# Patient Record
Sex: Female | Born: 1965 | Race: White | Hispanic: No | Marital: Single | Smoking: Current every day smoker
Health system: Southern US, Community
[De-identification: ages and names within clinical notes are randomized; demographics above are authoritative.]

## PROBLEM LIST (undated history)

## (undated) DIAGNOSIS — K219 Gastro-esophageal reflux disease without esophagitis: Secondary | ICD-10-CM

## (undated) DIAGNOSIS — B019 Varicella without complication: Secondary | ICD-10-CM

## (undated) HISTORY — DX: Gastro-esophageal reflux disease without esophagitis: K21.9

## (undated) HISTORY — PX: ROTATOR CUFF REPAIR: SHX139

## (undated) HISTORY — DX: Varicella without complication: B01.9

## (undated) HISTORY — PX: BREAST SURGERY: SHX581

---

## 2006-07-16 ENCOUNTER — Emergency Department: Payer: Self-pay | Admitting: Emergency Medicine

## 2007-01-17 ENCOUNTER — Ambulatory Visit: Payer: Self-pay | Admitting: Internal Medicine

## 2007-01-24 ENCOUNTER — Ambulatory Visit: Payer: Self-pay | Admitting: Internal Medicine

## 2007-01-31 ENCOUNTER — Ambulatory Visit: Payer: Self-pay | Admitting: Surgery

## 2007-02-07 ENCOUNTER — Ambulatory Visit: Payer: Self-pay | Admitting: Surgery

## 2007-02-07 HISTORY — PX: BREAST BIOPSY: SHX20

## 2013-08-20 ENCOUNTER — Inpatient Hospital Stay: Payer: Self-pay | Admitting: Specialist

## 2013-08-20 LAB — CBC
HCT: 38.8 % (ref 35.0–47.0)
HGB: 13.2 g/dL (ref 12.0–16.0)
MCH: 31.4 pg (ref 26.0–34.0)
MCHC: 34.1 g/dL (ref 32.0–36.0)
MCV: 92 fL (ref 80–100)
Platelet: 375 10*3/uL (ref 150–440)
RBC: 4.21 10*6/uL (ref 3.80–5.20)
RDW: 13.3 % (ref 11.5–14.5)
WBC: 18.2 10*3/uL — AB (ref 3.6–11.0)

## 2013-08-20 LAB — COMPREHENSIVE METABOLIC PANEL
ALK PHOS: 112 U/L
Albumin: 3.6 g/dL (ref 3.4–5.0)
Anion Gap: 6 — ABNORMAL LOW (ref 7–16)
BUN: 11 mg/dL (ref 7–18)
Bilirubin,Total: 1.1 mg/dL — ABNORMAL HIGH (ref 0.2–1.0)
CO2: 25 mmol/L (ref 21–32)
CREATININE: 0.75 mg/dL (ref 0.60–1.30)
Calcium, Total: 8.5 mg/dL (ref 8.5–10.1)
Chloride: 103 mmol/L (ref 98–107)
EGFR (Non-African Amer.): >60
Glucose: 107 mg/dL — ABNORMAL HIGH (ref 65–99)
OSMOLALITY: 268 (ref 275–301)
Potassium: 3.1 mmol/L — ABNORMAL LOW (ref 3.5–5.1)
SGOT(AST): 33 U/L (ref 15–37)
SGPT (ALT): 22 U/L (ref 12–78)
Sodium: 134 mmol/L — ABNORMAL LOW (ref 136–145)
TOTAL PROTEIN: 7.5 g/dL (ref 6.4–8.2)

## 2013-08-21 LAB — CBC WITH DIFFERENTIAL/PLATELET
Basophil #: 0 10*3/uL (ref 0.0–0.1)
Basophil %: 0.2 %
EOS ABS: 0.2 10*3/uL (ref 0.0–0.7)
Eosinophil %: 1.3 %
HCT: 32.5 % — ABNORMAL LOW (ref 35.0–47.0)
HGB: 11.2 g/dL — ABNORMAL LOW (ref 12.0–16.0)
Lymphocyte #: 1.7 10*3/uL (ref 1.0–3.6)
Lymphocyte %: 12 %
MCH: 31.8 pg (ref 26.0–34.0)
MCHC: 34.4 g/dL (ref 32.0–36.0)
MCV: 92 fL (ref 80–100)
Monocyte #: 0.9 x10 3/mm (ref 0.2–0.9)
Monocyte %: 6.2 %
Neutrophil #: 11.5 10*3/uL — ABNORMAL HIGH (ref 1.4–6.5)
Neutrophil %: 80.3 %
Platelet: 323 10*3/uL (ref 150–440)
RBC: 3.52 10*6/uL — AB (ref 3.80–5.20)
RDW: 13.4 % (ref 11.5–14.5)
WBC: 14.3 10*3/uL — ABNORMAL HIGH (ref 3.6–11.0)

## 2013-08-22 LAB — CBC WITH DIFFERENTIAL/PLATELET
Basophil #: 0 10*3/uL (ref 0.0–0.1)
Basophil %: 0.3 %
EOS PCT: 3.1 %
Eosinophil #: 0.3 10*3/uL (ref 0.0–0.7)
HCT: 31.4 % — AB (ref 35.0–47.0)
HGB: 11 g/dL — ABNORMAL LOW (ref 12.0–16.0)
LYMPHS PCT: 8.8 %
Lymphocyte #: 1 10*3/uL (ref 1.0–3.6)
MCH: 32.5 pg (ref 26.0–34.0)
MCHC: 35.1 g/dL (ref 32.0–36.0)
MCV: 93 fL (ref 80–100)
MONO ABS: 0.7 x10 3/mm (ref 0.2–0.9)
Monocyte %: 5.9 %
NEUTROS ABS: 9.2 10*3/uL — AB (ref 1.4–6.5)
NEUTROS PCT: 81.9 %
Platelet: 313 10*3/uL (ref 150–440)
RBC: 3.39 10*6/uL — AB (ref 3.80–5.20)
RDW: 12.8 % (ref 11.5–14.5)
WBC: 11.3 10*3/uL — ABNORMAL HIGH (ref 3.6–11.0)

## 2013-08-22 LAB — CREATININE, SERUM
Creatinine: 0.75 mg/dL (ref 0.60–1.30)
EGFR (African American): >60

## 2013-08-24 LAB — CBC WITH DIFFERENTIAL/PLATELET
BASOS PCT: 0.5 %
Basophil #: 0 10*3/uL (ref 0.0–0.1)
EOS ABS: 0.3 10*3/uL (ref 0.0–0.7)
Eosinophil %: 3.1 %
HCT: 33.1 % — ABNORMAL LOW (ref 35.0–47.0)
HGB: 11.5 g/dL — ABNORMAL LOW (ref 12.0–16.0)
LYMPHS ABS: 1.1 10*3/uL (ref 1.0–3.6)
Lymphocyte %: 13.3 %
MCH: 32 pg (ref 26.0–34.0)
MCHC: 34.8 g/dL (ref 32.0–36.0)
MCV: 92 fL (ref 80–100)
Monocyte #: 0.8 x10 3/mm (ref 0.2–0.9)
Monocyte %: 9.8 %
Neutrophil #: 5.9 10*3/uL (ref 1.4–6.5)
Neutrophil %: 73.3 %
PLATELETS: 404 10*3/uL (ref 150–440)
RBC: 3.61 10*6/uL — ABNORMAL LOW (ref 3.80–5.20)
RDW: 13 % (ref 11.5–14.5)
WBC: 8.1 10*3/uL (ref 3.6–11.0)

## 2013-08-24 LAB — CREATININE, SERUM
Creatinine: 0.78 mg/dL (ref 0.60–1.30)
EGFR (African American): >60

## 2013-08-24 LAB — VANCOMYCIN, TROUGH: VANCOMYCIN, TROUGH: 7 ug/mL — AB (ref 10–20)

## 2013-08-25 LAB — VANCOMYCIN, TROUGH: Vancomycin, Trough: 17 ug/mL (ref 10–20)

## 2013-08-26 LAB — WOUND CULTURE

## 2014-09-27 NOTE — Consult Note (Signed)
Brief Consult Note: Diagnosis: left thigh abscess and cellulitis.   Patient was seen by consultant.   Consult note dictated.   Recommend further assessment or treatment.   Orders entered.   Discussed with Attending MD.   Comments: no packing being changed since admit. Orders entered for TID dressings. will reexamine.  Electronic Signatures: Florene Glen (MD)  (Signed 19-Mar-15 13:42)  Authored: Brief Consult Note   Last Updated: 19-Mar-15 13:42 by Florene Glen (MD)

## 2014-09-27 NOTE — Discharge Summary (Signed)
PATIENT NAME:  Julie Cole, Julie Cole MR#:  [id number] DATE OF BIRTH:  07-Mar-1966  DATE OF ADMISSION:  08/20/2013 DATE OF DISCHARGE:  08/25/2013  For a detailed note, please see the history and physical done on admission by Dr. Fritzi Mandes.   DIAGNOSES AT DISCHARGE: As follows:  1. Systemic inflammatory response syndrome secondary to abscess/cellulitis in the left inguinal area.  2. Leukocytosis.   DIET: The patient is being discharged on a regular diet.   ACTIVITY: As tolerated.   Follow-up with Dr. Phoebe Perch from general surgery next week.  DISCHARGE MEDICATIONS: Bactrim double strength 1 tab b.i.d. x 10 days and ibuprofen 600 mg q.6 hours as needed.   CONSULTANTS DURING THE HOSPITAL COURSE: Dr. Phoebe Perch from general surgery.   PERTINENT STUDIES DONE DURING THE HOSPITAL COURSE: Are as follows: The patient's wound cultures to be positive for normal skin flora.   HOSPITAL COURSE: This is a 49 year old female with medical problems as mentioned above, presented to the hospital with left inguinal area of redness and swelling and also fevers.   PROBLEMS: 1. Systemic inflammatory response syndrome. This was the likely diagnosis. The patient presented with fever, tachycardia and a left inguinal area/groin area cellulitis. The patient was admitted to the hospital. Initially started on IV Ancef and oral Bactrim. The patient had a small incision and drainage done by an ER physician prior to admission. The patient shortly after admission had some worsening redness and swelling; therefore, a surgical consult was obtained. The patient was seen by Dr. Burt Knack, who did not perform any further incision and drainage, but just changed the packing and the dressing. We switched her from Ancef to IV vancomycin. The patient has significantly improved since then. She had redness, has significantly gone down. She continues to still have a small hardened area, which is likely inflammatory in nature and unlikely  a fluid collection as per surgery. At this point, I am discharging her on some oral Bactrim and ibuprofen for pain control.  2. Left inguinal cellulitis/abscess. The patient initially was treated with IV Ancef and Bactrim. eventually switched over to IV vancomycin as she was not clinically improving. A surgical consult was obtained. She did not require any further incision and drainage. She just needed to have her dressing changed and the packing removed, which was done by Dr. Burt Knack. She has clinically improved since then. Currently she is being discharged on oral Bactrim. She will follow up with Dr. Burt Knack as an outpatient next week or so.  3. Leukocytosis. This was likely secondary to the cellulitis/abscess. It has significantly improved and now normalized with IV antibiotic therapy.   The patient is a FULL CODE.   TIME SPENT ON DISCHARGE: 40 minutes   ____________________________ Belia Heman. Verdell Carmine, MD vjs:sg D: 08/25/2013 16:15:38 ET T: 08/26/2013 07:06:12 ET JOB#: [id number]  cc: Belia Heman. Verdell Carmine, MD, <Dictator> Richard E. Burt Knack, MD  Henreitta Leber MD ELECTRONICALLY SIGNED 08/30/2013 20:27

## 2014-09-27 NOTE — H&P (Signed)
PATIENT NAME:  Julie Cole, Julie Cole MR#:  [id number] DATE OF BIRTH:  1966/01/10  DATE OF ADMISSION:  08/20/2013  PRIMARY CARE PHYSICIAN:  None.  CHIEF COMPLAINT:   Left groin redness and swelling with pain for 2 days.   Julie Cole is a 49 year old obese Caucasian female with no significant past medical history, comes into the Emergency Room with complaints of pain and redness along with swelling in her left groin. She was found to have a large abscess, for which incision and drainage was done by Dr. Thomasene Lot in the Emergency Room, along with some packing done. The patient has significant cellulitis with elevated white count. She was tachycardic also on arrival. She is being admitted with SIRS secondary to left groin abscess. She received IV Keflex p.o.  Bactrim has been started.   PAST MEDICAL HISTORY:  None.   ALLERGIES: No known drug allergies.   MEDICATIONS: None.   SOCIAL HISTORY: Lives at home. She works with [employer]. Smokes 1 to 2 times a week. Drinks alcohol then she smokes.   FAMILY HISTORY: Not aware of any family history with mother and father.   REVIEW OF SYSTEMS:  CONSTITUTIONAL: No fever. Positive for fatigue, weakness and left groin pain.  EYES: No blurred or double vision, glaucoma or cataracts.  ENT: No tinnitus, ear pain, hearing loss.  RESPIRATORY: No cough, wheeze, hemoptysis or COPD.  CARDIOVASCULAR: No chest pain, orthopnea, edema or hypertension.  GASTROINTESTINAL: No nausea, vomiting, diarrhea, abdominal pain or GERD.  GENITOURINARY: No dysuria or hematuria.  ENDOCRINE: No polyuria, nocturia or thyroid problems.  HEMATOLOGY: No anemia or easy bruising.  SKIN: Positive for cellulitis over the left groin along with an abscess.  MUSCULOSKELETAL: No arthritis, swelling or gout.  NEUROLOGIC: No CVA, TIA, vertigo or ataxia.  PSYCHIATRIC: No anxiety, depression or bipolar disorder. All other systems reviewed are negative.   PHYSICAL  EXAMINATION: GENERAL: The patient is awake, alert, oriented x 3, not in acute distress.  VITAL SIGNS: Afebrile. Pulse is 100, respirations 18, blood pressure 138/74. Morbidly obese.  HEENT: Atraumatic, normocephalic. Pupils: PERRLA.  EOM intact. Oral mucosa is moist.  NECK: Supple. No JVD. No carotid bruits.  RESPIRATORY: Clear to auscultation bilaterally. No rales, rhonchi, respiratory distress or labored breathing.  CARDIOVASCULAR: Both the heart sounds are normal. Rate, rhythm regular. PMI not lateralized. Chest is nontender. Good pedal pulses, good femoral pulses. No lower extremity edema.  ABDOMEN: Soft, benign, nontender. No organomegaly.  EXTREMITIES: The patient has got a large abscess over the left groin with incision and drainage with packing done. She has got cellulitis around the area, which has been marked with a marker.  NEUROLOGIC: Grossly intact cranial nerves II through XII. No motor or sensory deficit.  PSYCHIATRIC: The patient is awake, alert, oriented x 3.   White count is 18.2. H and H are 13.2 and 38.8. Platelet count is 375. Comprehensive metabolic panel within normal limits except potassium of 3.1, sodium of 134 and glucose of 107.   ASSESSMENT AND PLAN:  A 49 year old Julie Cole with history of tobacco and alcohol use in moderation, comes in with:  1.  Systemic inflammatory response syndrome secondary to left groin abscess, which is status post incision and drainage by Dr. Thomasene Lot in the Emergency Room. Wound culture has been sent.  We will admit the patient to the medical floor. Continue IV Keflex around the clock, along with Bactrim for MRSA coverage. Wound cultures have been sent. Follow blood cultures and wound cultures.  P.r.n. pain medications with ibuprofen and Percocet.  2.  Leukocytosis secondary to abscess. Follow up counts in the morning.  3.  Tachycardia secondary to abscess and systemic inflammatory response syndrome.  4. Tobacco abuse. The patient  smokes about 1 to 3 times a week. She is advised on smoking cessation, about 3 minutes spent on counseling.  5. Deep vein thrombosis prophylaxis. The patient is ambulatory.   Further workup according to the patient's clinical course. Hospital admission plan was discussed with patient, who is agreeable to it.   TIME SPENT: 50 minutes.   ____________________________ Hart Rochester Posey Pronto, MD sap:dmm D: 08/20/2013 21:02:37 ET T: 08/20/2013 22:21:26 ET JOB#: [id number]  cc: Skyelar Swigart A. Posey Pronto, MD, <Dictator> Ilda Basset MD ELECTRONICALLY SIGNED 08/23/2013 10:48

## 2014-09-27 NOTE — Consult Note (Signed)
PATIENT NAME:  Julie Cole, Julie Cole MR#:  [id number] DATE OF BIRTH:  27-Jul-1965  DATE OF CONSULTATION:  08/22/2013  CONSULTING PHYSICIAN:  Jerrol Banana. Burt Knack, MD  CHIEF COMPLAINT: Left thigh abscess.   HISTORY OF PRESENT ILLNESS: This is a patient who has had left thigh cellulitis and pain worsening over the last three days. She was admitted to the hospital yesterday where the Emergency Room physician performed lancing of the abscess in the left groin. Cultures are currently pending. Dr. Verdell Carmine asked me to see the patient concerning worsening of her redness.   PAST MEDICAL HISTORY: Nondiabetic, morbid obesity.   PAST SURGICAL HISTORY: None.   ALLERGIES: None.   MEDICATIONS: None.   FAMILY HISTORY: Noncontributory.   SOCIAL HISTORY: The patient smokes occasionally. Does not drink alcohol.   REVIEW OF SYSTEMS:  A 10 system review is performed and negative with the exception of that mentioned in the HPI.   PHYSICAL EXAMINATION: GENERAL: Morbidly obese female patient with a BMI of 38.  VITAL SIGNS: Temperature is 99, pulse 72, respirations 20, blood pressure 131/81.  HEENT: No scleral icterus.  NECK: No palpable neck nodes.  ABDOMEN: Soft, nontender.  EXTREMITIES: Without edema.  NEUROLOGIC: Grossly intact.  INTEGUMENT: Erythema of the proximal left groin that does not radiate through the posterior thigh, it is all anterior and medial and markings have been placed. There is a 1.5 cm wound with packing in place. That packing has never been removed from the I and D yesterday and is covered in purulent material. Minimal tenderness. No other fluctuance.   Dressing and material was removed from the wound.   LABORATORY VALUES: Reviewed.  Cultures are currently pending. White blood cell count is fallen to 11.3.   I discussed with Dr. Verdell Carmine the findings in the wound and I recommend q.8 dressing changes. I spoke in the nursing and placed orders. I do not think this requires any additional  drainage at this point, but if it does not rapidly improve now the dressings are being changed in the next 24 to 48 hours, exam under anesthesia and further drainage may be indicated.  ____________________________ Jerrol Banana. Burt Knack, MD rec:sg D: 08/22/2013 13:45:00 ET T: 08/22/2013 14:03:41 ET JOB#: [id number]  cc: Jerrol Banana. Burt Knack, MD, <Dictator> Florene Glen MD ELECTRONICALLY SIGNED 08/22/2013 17:41

## 2014-09-30 ENCOUNTER — Ambulatory Visit
Admission: RE | Admit: 2014-09-30 | Discharge: 2014-09-30 | Disposition: A | Discharge status: Home / Self Care | Payer: 59 | Source: Ambulatory Visit | Attending: Orthopedic Surgery | Admitting: Orthopedic Surgery

## 2014-09-30 ENCOUNTER — Other Ambulatory Visit: Payer: Self-pay | Admitting: Orthopedic Surgery

## 2014-09-30 DIAGNOSIS — Z87891 Personal history of nicotine dependence: Secondary | ICD-10-CM

## 2014-09-30 DIAGNOSIS — Z01818 Encounter for other preprocedural examination: Secondary | ICD-10-CM

## 2017-03-23 ENCOUNTER — Ambulatory Visit (INDEPENDENT_AMBULATORY_CARE_PROVIDER_SITE_OTHER): Payer: 59 | Admitting: Family

## 2017-03-23 ENCOUNTER — Encounter: Payer: Self-pay | Admitting: Family

## 2017-03-23 VITALS — BP 136/80 | HR 76 | Temp 97.8°F | Ht 69.0 in | Wt 260.6 lb

## 2017-03-23 DIAGNOSIS — Z23 Encounter for immunization: Secondary | ICD-10-CM | POA: Diagnosis not present

## 2017-03-23 DIAGNOSIS — Z7689 Persons encountering health services in other specified circumstances: Secondary | ICD-10-CM

## 2017-03-23 DIAGNOSIS — K625 Hemorrhage of anus and rectum: Secondary | ICD-10-CM | POA: Diagnosis not present

## 2017-03-23 DIAGNOSIS — Z0000 Encounter for general adult medical examination without abnormal findings: Secondary | ICD-10-CM | POA: Insufficient documentation

## 2017-03-23 LAB — CBC WITH DIFFERENTIAL/PLATELET
Basophils Absolute: 0 10*3/uL (ref 0.0–0.1)
Basophils Relative: 1 % (ref 0.0–3.0)
Eosinophils Absolute: 0.1 10*3/uL (ref 0.0–0.7)
Eosinophils Relative: 3.9 % (ref 0.0–5.0)
HCT: 39.8 % (ref 36.0–46.0)
Hemoglobin: 13.4 g/dL (ref 12.0–15.0)
Lymphocytes Relative: 26.6 % (ref 12.0–46.0)
Lymphs Abs: 0.9 10*3/uL (ref 0.7–4.0)
MCHC: 33.8 g/dL (ref 30.0–36.0)
MCV: 94.9 fl (ref 78.0–100.0)
Monocytes Absolute: 0.4 10*3/uL (ref 0.1–1.0)
Monocytes Relative: 11 % (ref 3.0–12.0)
Neutro Abs: 2 10*3/uL (ref 1.4–7.7)
Neutrophils Relative %: 57.5 % (ref 43.0–77.0)
Platelets: 333 10*3/uL (ref 150.0–400.0)
RBC: 4.19 Mil/uL (ref 3.87–5.11)
RDW: 13.7 % (ref 11.5–15.5)
WBC: 3.4 10*3/uL — ABNORMAL LOW (ref 4.0–10.5)

## 2017-03-23 LAB — COMPREHENSIVE METABOLIC PANEL
ALK PHOS: 67 U/L (ref 39–117)
ALT: 18 U/L (ref 0–35)
AST: 22 U/L (ref 0–37)
Albumin: 4.4 g/dL (ref 3.5–5.2)
BILIRUBIN TOTAL: 0.5 mg/dL (ref 0.2–1.2)
BUN: 17 mg/dL (ref 6–23)
CALCIUM: 9.8 mg/dL (ref 8.4–10.5)
CO2: 30 meq/L (ref 19–32)
CREATININE: 0.72 mg/dL (ref 0.40–1.20)
Chloride: 105 mEq/L (ref 96–112)
GFR: 90.74 mL/min (ref >60.00)
GLUCOSE: 90 mg/dL (ref 70–99)
Potassium: 4.1 mEq/L (ref 3.5–5.1)
Sodium: 142 mEq/L (ref 135–145)
TOTAL PROTEIN: 7.2 g/dL (ref 6.0–8.3)

## 2017-03-23 LAB — VITAMIN D 25 HYDROXY (VIT D DEFICIENCY, FRACTURES): VITD: 12.95 ng/mL — AB (ref 30.00–100.00)

## 2017-03-23 LAB — LIPID PANEL
CHOL/HDL RATIO: 4
CHOLESTEROL: 224 mg/dL — AB (ref 0–200)
HDL: 56.6 mg/dL (ref >39.00)
NONHDL: 167.55
Triglycerides: 259 mg/dL — ABNORMAL HIGH (ref 0.0–149.0)
VLDL: 51.8 mg/dL — ABNORMAL HIGH (ref 0.0–40.0)

## 2017-03-23 LAB — HEMOGLOBIN A1C: Hgb A1c MFr Bld: 5.1 % (ref 4.6–6.5)

## 2017-03-23 LAB — TSH: TSH: 0.99 u[IU]/mL (ref 0.35–4.50)

## 2017-03-23 LAB — LDL CHOLESTEROL, DIRECT: LDL DIRECT: 124 mg/dL

## 2017-03-23 NOTE — Assessment & Plan Note (Signed)
Pending cpe labs. Patient will schedule mammogram. She will return for cpe and pap.

## 2017-03-23 NOTE — Assessment & Plan Note (Addendum)
Resolved at this time. Symptoms most consistent with hemorrhoids. Patient politely declines rectal exam today. Due to the rectal bleeding, patient jointly agreed with pursue colonoscopy versus Cologuard. Ordered colonoscopy. Return precautions given.

## 2017-03-23 NOTE — Progress Notes (Signed)
Subjective:    Patient ID: Julie Cole, female    DOB: 07-07-1965, 51 y.o.   MRN: [id number]  CC: MONICA CODD is a 51 y.o. female who presents today to establish care.    HPI: 62 with [facility] for acne- on spironolactone.   She declines a chronic, intermittent rectal bleeding. None the past 3 months.   Bright red blood on toilet paper. Notes when 'straining.' No anal itching, pain with BM. No melana, coffee ground stools, N, V, adbominal pain.  Regular bowels habits, usually daily.   No longer have menstrual cycles.      HISTORY:  Past Medical History:  Diagnosis Date  . Chicken pox   . GERD (gastroesophageal reflux disease)    Past Surgical History:  Procedure Laterality Date  . BREAST SURGERY    . ROTATOR CUFF REPAIR     Family History  Problem Relation Age of Onset  . Colon cancer Neg Hx   . Breast cancer Neg Hx     Allergies: Patient has no allergy information on record. No current outpatient prescriptions on file prior to visit.   No current facility-administered medications on file prior to visit.     Social History  Substance Use Topics  . Smoking status: Current Every Day Smoker  . Smokeless tobacco: Never Used  . Alcohol use Yes    Review of Systems  Constitutional: Negative for chills and fever.  Respiratory: Negative for cough.   Cardiovascular: Negative for chest pain and palpitations.  Gastrointestinal: Positive for anal bleeding. Negative for abdominal pain, blood in stool, constipation, diarrhea, nausea and vomiting.      Objective:    BP 136/80   Pulse 76   Temp 97.8 F (36.6 C) (Oral)   Ht 5\' 9"  (1.753 m)   Wt 260 lb 9.6 oz (118.2 kg)   SpO2 96%   BMI 38.48 kg/m  BP Readings from Last 3 Encounters:  03/23/17 136/80   Wt Readings from Last 3 Encounters:  03/23/17 260 lb 9.6 oz (118.2 kg)    Physical Exam  Constitutional: She appears well-developed and well-nourished.  Eyes: Conjunctivae are normal.   Cardiovascular: Normal rate, regular rhythm, normal heart sounds and normal pulses.   Pulmonary/Chest: Effort normal and breath sounds normal. She has no wheezes. She has no rhonchi. She has no rales.  Neurological: She is alert.  Skin: Skin is warm and dry.  Psychiatric: She has a normal mood and affect. Her speech is normal and behavior is normal. Thought content normal.  Vitals reviewed.      Assessment & Plan:   Problem List Items Addressed This Visit      Digestive   Rectal bleeding    Resolved at this time. Symptoms most consistent with hemorrhoids. Patient politely declines rectal exam today. Due to the rectal bleeding, patient jointly agreed with pursue colonoscopy versus Cologuard. Ordered colonoscopy. Return precautions given.        Other   Encounter to establish care - Primary    Pending cpe labs. Patient will schedule mammogram. She will return for cpe and pap.       Relevant Orders   CBC with Differential/Platelet   Comprehensive metabolic panel   Hemoglobin A1c   Lipid panel   TSH   VITAMIN D 25 Hydroxy (Vit-D Deficiency, Fractures)   HIV antibody   MM SCREENING BREAST TOMO BILATERAL   Ambulatory referral to Gastroenterology    Other Visit Diagnoses    Need for  immunization against influenza       Relevant Orders   Flu Vaccine QUAD 36+ mos IM (Completed)       I am having Ms. Mcmanaman maintain her spironolactone.   Meds ordered this encounter  Medications  . spironolactone (ALDACTONE) 50 MG tablet    Sig: Take 50 mg by mouth daily.    Return precautions given.   Risks, benefits, and alternatives of the medications and treatment plan prescribed today were discussed, and patient expressed understanding.   Education regarding symptom management and diagnosis given to patient on AVS.  Continue to follow with Burnard Hawthorne, FNP for routine health maintenance.   Julie Cole and I agreed with plan.   Mable Paris, FNP

## 2017-03-23 NOTE — Progress Notes (Signed)
Pre visit review using our clinic review tool, if applicable. No additional management support is needed unless otherwise documented below in the visit note. 

## 2017-03-23 NOTE — Patient Instructions (Addendum)
Tdap ( tetanus) at local pharmacy  Fasting labs  If you see blood again from rectum, please let me know  Colonoscopy orderd  For suspected hemorrhoids, ensure plenty of water, high fiber diet. May also start colace, stool softener, to reduce straining   Please return for pap smear and annual physical   Hemorrhoids Hemorrhoids are swollen veins in and around the rectum or anus. There are two types of hemorrhoids:  Internal hemorrhoids. These occur in the veins that are just inside the rectum. They may poke through to the outside and become irritated and painful.  External hemorrhoids. These occur in the veins that are outside of the anus and can be felt as a painful swelling or hard lump near the anus.  Most hemorrhoids do not cause serious problems, and they can be managed with home treatments such as diet and lifestyle changes. If home treatments do not help your symptoms, procedures can be done to shrink or remove the hemorrhoids. What are the causes? This condition is caused by increased pressure in the anal area. This pressure may result from various things, including:  Constipation.  Straining to have a bowel movement.  Diarrhea.  Pregnancy.  Obesity.  Sitting for long periods of time.  Heavy lifting or other activity that causes you to strain.  Anal sex.  What are the signs or symptoms? Symptoms of this condition include:  Pain.  Anal itching or irritation.  Rectal bleeding.  Leakage of stool (feces).  Anal swelling.  One or more lumps around the anus.  How is this diagnosed? This condition can often be diagnosed through a visual exam. Other exams or tests may also be done, such as:  Examination of the rectal area with a gloved hand (digital rectal exam).  Examination of the anal canal using a small tube (anoscope).  A blood test, if you have lost a significant amount of blood.  A test to look inside the colon (sigmoidoscopy or  colonoscopy).  How is this treated? This condition can usually be treated at home. However, various procedures may be done if dietary changes, lifestyle changes, and other home treatments do not help your symptoms. These procedures can help make the hemorrhoids smaller or remove them completely. Some of these procedures involve surgery, and others do not. Common procedures include:  Rubber band ligation. Rubber bands are placed at the base of the hemorrhoids to cut off the blood supply to them.  Sclerotherapy. Medicine is injected into the hemorrhoids to shrink them.  Infrared coagulation. A type of light energy is used to get rid of the hemorrhoids.  Hemorrhoidectomy surgery. The hemorrhoids are surgically removed, and the veins that supply them are tied off.  Stapled hemorrhoidopexy surgery. A circular stapling device is used to remove the hemorrhoids and use staples to cut off the blood supply to them.  Follow these instructions at home: Eating and drinking  Eat foods that have a lot of fiber in them, such as whole grains, beans, nuts, fruits, and vegetables. Ask your health care provider about taking products that have added fiber (fiber supplements).  Drink enough fluid to keep your urine clear or pale yellow. Managing pain and swelling  Take warm sitz baths for 20 minutes, 3-4 times a day to ease pain and discomfort.  If directed, apply ice to the affected area. Using ice packs between sitz baths may be helpful. ? Put ice in a plastic bag. ? Place a towel between your skin and the bag. ?  Leave the ice on for 20 minutes, 2-3 times a day. General instructions  Take over-the-counter and prescription medicines only as told by your health care provider.  Use medicated creams or suppositories as told.  Exercise regularly.  Go to the bathroom when you have the urge to have a bowel movement. Do not wait.  Avoid straining to have bowel movements.  Keep the anal area dry and  clean. Use wet toilet paper or moist towelettes after a bowel movement.  Do not sit on the toilet for long periods of time. This increases blood pooling and pain. Contact a health care provider if:  You have increasing pain and swelling that are not controlled by treatment or medicine.  You have uncontrolled bleeding.  You have difficulty having a bowel movement, or you are unable to have a bowel movement.  You have pain or inflammation outside the area of the hemorrhoids. This information is not intended to replace advice given to you by your health care provider. Make sure you discuss any questions you have with your health care provider. Document Released: 05/20/2000 Document Revised: 10/21/2015 Document Reviewed: 02/04/2015 Elsevier Interactive Patient Education  2017 [employer].

## 2017-03-24 LAB — HIV ANTIBODY (ROUTINE TESTING W REFLEX): HIV: NONREACTIVE

## 2017-03-30 ENCOUNTER — Inpatient Hospital Stay: Admission: RE | Admit: 2017-03-30 | Payer: 59 | Source: Ambulatory Visit

## 2017-04-06 ENCOUNTER — Other Ambulatory Visit (HOSPITAL_COMMUNITY)
Admission: RE | Admit: 2017-04-06 | Discharge: 2017-04-06 | Disposition: A | Discharge status: Home / Self Care | Payer: 59 | Source: Ambulatory Visit | Attending: Family | Admitting: Family

## 2017-04-06 ENCOUNTER — Encounter: Payer: Self-pay | Admitting: Family

## 2017-04-06 ENCOUNTER — Ambulatory Visit (INDEPENDENT_AMBULATORY_CARE_PROVIDER_SITE_OTHER): Payer: 59 | Admitting: Family

## 2017-04-06 VITALS — BP 116/74 | HR 86 | Temp 97.6°F | Ht 69.0 in | Wt 258.0 lb

## 2017-04-06 DIAGNOSIS — Z0000 Encounter for general adult medical examination without abnormal findings: Secondary | ICD-10-CM | POA: Diagnosis not present

## 2017-04-06 NOTE — Patient Instructions (Addendum)
tdap due  Pleasure seeing you!  Health Maintenance, Female Adopting a healthy lifestyle and getting preventive care can go a long way to promote health and wellness. Talk with your health care provider about what schedule of regular examinations is right for you. This is a good chance for you to check in with your provider about disease prevention and staying healthy. In between checkups, there are plenty of things you can do on your own. Experts have done a lot of research about which lifestyle changes and preventive measures are most likely to keep you healthy. Ask your health care provider for more information. Weight and diet Eat a healthy diet  Be sure to include plenty of vegetables, fruits, low-fat dairy products, and lean protein.  Do not eat a lot of foods high in solid fats, added sugars, or salt.  Get regular exercise. This is one of the most important things you can do for your health. ? Most adults should exercise for at least 150 minutes each week. The exercise should increase your heart rate and make you sweat (moderate-intensity exercise). ? Most adults should also do strengthening exercises at least twice a week. This is in addition to the moderate-intensity exercise.  Maintain a healthy weight  Body mass index (BMI) is a measurement that can be used to identify possible weight problems. It estimates body fat based on height and weight. Your health care provider can help determine your BMI and help you achieve or maintain a healthy weight.  For females 68 years of age and older: ? A BMI below 18.5 is considered underweight. ? A BMI of 18.5 to 24.9 is normal. ? A BMI of 25 to 29.9 is considered overweight. ? A BMI of 30 and above is considered obese.  Watch levels of cholesterol and blood lipids  You should start having your blood tested for lipids and cholesterol at 51 years of age, then have this test every 5 years.  You may need to have your cholesterol levels  checked more often if: ? Your lipid or cholesterol levels are high. ? You are older than 51 years of age. ? You are at high risk for heart disease.  Cancer screening Lung Cancer  Lung cancer screening is recommended for adults 71-50 years old who are at high risk for lung cancer because of a history of smoking.  A yearly low-dose CT scan of the lungs is recommended for people who: ? Currently smoke. ? Have quit within the past 15 years. ? Have at least a 30-pack-year history of smoking. A pack year is smoking an average of one pack of cigarettes a day for 1 year.  Yearly screening should continue until it has been 15 years since you quit.  Yearly screening should stop if you develop a health problem that would prevent you from having lung cancer treatment.  Breast Cancer  Practice breast self-awareness. This means understanding how your breasts normally appear and feel.  It also means doing regular breast self-exams. Let your health care provider know about any changes, no matter how small.  If you are in your 20s or 30s, you should have a clinical breast exam (CBE) by a health care provider every 1-3 years as part of a regular health exam.  If you are 42 or older, have a CBE every year. Also consider having a breast X-ray (mammogram) every year.  If you have a family history of breast cancer, talk to your health care provider about genetic screening.  If you are at high risk for breast cancer, talk to your health care provider about having an MRI and a mammogram every year.  Breast cancer gene (BRCA) assessment is recommended for women who have family members with BRCA-related cancers. BRCA-related cancers include: ? Breast. ? Ovarian. ? Tubal. ? Peritoneal cancers.  Results of the assessment will determine the need for genetic counseling and BRCA1 and BRCA2 testing.  Cervical Cancer Your health care provider may recommend that you be screened regularly for cancer of the  pelvic organs (ovaries, uterus, and vagina). This screening involves a pelvic examination, including checking for microscopic changes to the surface of your cervix (Pap test). You may be encouraged to have this screening done every 3 years, beginning at age 71.  For women ages 82-65, health care providers may recommend pelvic exams and Pap testing every 3 years, or they may recommend the Pap and pelvic exam, combined with testing for human papilloma virus (HPV), every 5 years. Some types of HPV increase your risk of cervical cancer. Testing for HPV may also be done on women of any age with unclear Pap test results.  Other health care providers may not recommend any screening for nonpregnant women who are considered low risk for pelvic cancer and who do not have symptoms. Ask your health care provider if a screening pelvic exam is right for you.  If you have had past treatment for cervical cancer or a condition that could lead to cancer, you need Pap tests and screening for cancer for at least 20 years after your treatment. If Pap tests have been discontinued, your risk factors (such as having a new sexual partner) need to be reassessed to determine if screening should resume. Some women have medical problems that increase the chance of getting cervical cancer. In these cases, your health care provider may recommend more frequent screening and Pap tests.  Colorectal Cancer  This type of cancer can be detected and often prevented.  Routine colorectal cancer screening usually begins at 51 years of age and continues through 51 years of age.  Your health care provider may recommend screening at an earlier age if you have risk factors for colon cancer.  Your health care provider may also recommend using home test kits to check for hidden blood in the stool.  A small camera at the end of a tube can be used to examine your colon directly (sigmoidoscopy or colonoscopy). This is done to check for the  earliest forms of colorectal cancer.  Routine screening usually begins at age 15.  Direct examination of the colon should be repeated every 5-10 years through 51 years of age. However, you may need to be screened more often if early forms of precancerous polyps or small growths are found.  Skin Cancer  Check your skin from head to toe regularly.  Tell your health care provider about any new moles or changes in moles, especially if there is a change in a mole's shape or color.  Also tell your health care provider if you have a mole that is larger than the size of a pencil eraser.  Always use sunscreen. Apply sunscreen liberally and repeatedly throughout the day.  Protect yourself by wearing long sleeves, pants, a wide-brimmed hat, and sunglasses whenever you are outside.  Heart disease, diabetes, and high blood pressure  High blood pressure causes heart disease and increases the risk of stroke. High blood pressure is more likely to develop in: ? People who have blood  pressure in the high end of the normal range (130-139/85-89 mm Hg). ? People who are overweight or obese. ? People who are African American.  If you are 56-87 years of age, have your blood pressure checked every 3-5 years. If you are 74 years of age or older, have your blood pressure checked every year. You should have your blood pressure measured twice-once when you are at a hospital or clinic, and once when you are not at a hospital or clinic. Record the average of the two measurements. To check your blood pressure when you are not at a hospital or clinic, you can use: ? An automated blood pressure machine at a pharmacy. ? A home blood pressure monitor.  If you are between 69 years and 79 years old, ask your health care provider if you should take aspirin to prevent strokes.  Have regular diabetes screenings. This involves taking a blood sample to check your fasting blood sugar level. ? If you are at a normal weight and  have a low risk for diabetes, have this test once every three years after 51 years of age. ? If you are overweight and have a high risk for diabetes, consider being tested at a younger age or more often. Preventing infection Hepatitis B  If you have a higher risk for hepatitis B, you should be screened for this virus. You are considered at high risk for hepatitis B if: ? You were born in a country where hepatitis B is common. Ask your health care provider which countries are considered high risk. ? Your parents were born in a high-risk country, and you have not been immunized against hepatitis B (hepatitis B vaccine). ? You have HIV or AIDS. ? You use needles to inject street drugs. ? You live with someone who has hepatitis B. ? You have had sex with someone who has hepatitis B. ? You get hemodialysis treatment. ? You take certain medicines for conditions, including cancer, organ transplantation, and autoimmune conditions.  Hepatitis C  Blood testing is recommended for: ? Everyone born from [birth year] through 1965. ? Anyone with known risk factors for hepatitis C.  Sexually transmitted infections (STIs)  You should be screened for sexually transmitted infections (STIs) including gonorrhea and chlamydia if: ? You are sexually active and are younger than 51 years of age. ? You are older than 51 years of age and your health care provider tells you that you are at risk for this type of infection. ? Your sexual activity has changed since you were last screened and you are at an increased risk for chlamydia or gonorrhea. Ask your health care provider if you are at risk.  If you do not have HIV, but are at risk, it may be recommended that you take a prescription medicine daily to prevent HIV infection. This is called pre-exposure prophylaxis (PrEP). You are considered at risk if: ? You are sexually active and do not regularly use condoms or know the HIV status of your partner(s). ? You take drugs by  injection. ? You are sexually active with a partner who has HIV.  Talk with your health care provider about whether you are at high risk of being infected with HIV. If you choose to begin PrEP, you should first be tested for HIV. You should then be tested every 3 months for as long as you are taking PrEP. Pregnancy  If you are premenopausal and you may become pregnant, ask your health care provider about preconception  counseling.  If you may become pregnant, take 400 to 800 micrograms (mcg) of folic acid every day.  If you want to prevent pregnancy, talk to your health care provider about birth control (contraception). Osteoporosis and menopause  Osteoporosis is a disease in which the bones lose minerals and strength with aging. This can result in serious bone fractures. Your risk for osteoporosis can be identified using a bone density scan.  If you are 74 years of age or older, or if you are at risk for osteoporosis and fractures, ask your health care provider if you should be screened.  Ask your health care provider whether you should take a calcium or vitamin D supplement to lower your risk for osteoporosis.  Menopause may have certain physical symptoms and risks.  Hormone replacement therapy may reduce some of these symptoms and risks. Talk to your health care provider about whether hormone replacement therapy is right for you. Follow these instructions at home:  Schedule regular health, dental, and eye exams.  Stay current with your immunizations.  Do not use any tobacco products including cigarettes, chewing tobacco, or electronic cigarettes.  If you are pregnant, do not drink alcohol.  If you are breastfeeding, limit how much and how often you drink alcohol.  Limit alcohol intake to no more than 1 drink per day for nonpregnant women. One drink equals 12 ounces of beer, 5 ounces of wine, or 1 ounces of hard liquor.  Do not use street drugs.  Do not share needles.  Ask  your health care provider for help if you need support or information about quitting drugs.  Tell your health care provider if you often feel depressed.  Tell your health care provider if you have ever been abused or do not feel safe at home. This information is not intended to replace advice given to you by your health care provider. Make sure you discuss any questions you have with your health care provider. Document Released: 12/06/2010 Document Revised: 10/29/2015 Document Reviewed: 02/24/2015 Elsevier Interactive Patient Education  [employer].

## 2017-04-06 NOTE — Progress Notes (Signed)
Subjective:    Patient ID: Julie Cole, female    DOB: [birth year], 51 y.o.   MRN: [id number]  CC: FATEN FRIESON is a 51 y.o. female who presents today for physical exam.    HPI: Feeling well. No complaints    Colorectal Cancer Screening: due Breast Cancer Screening: Mammogram scheduled Cervical Cancer Screening:due Bone Health screening/DEXA for 65+: No increased fracture risk. Defer screening at this time. Lung Cancer Screening: Doesn't have 30 year pack year history and age > 43 years. Social smoking. Started at 51 yrs old. Doenst meet age.       Tetanus - due         Labs: Screening labs today. Exercise: Gets regular exercise.  Alcohol use: couple times per week Smoking/tobacco use: smoker.  Regular dental exams: UTD Wears seat belt: Yes. Skin: no h/o skin cancer; follows with dermatology, Dr Phillip Heal  HISTORY:  Past Medical History:  Diagnosis Date  . Chicken pox   . GERD (gastroesophageal reflux disease)     Past Surgical History:  Procedure Laterality Date  . BREAST SURGERY    . ROTATOR CUFF REPAIR     Family History  Problem Relation Age of Onset  . Colon cancer Neg Hx   . Breast cancer Neg Hx       ALLERGIES: Patient has no allergy information on record.  Current Outpatient Prescriptions on File Prior to Visit  Medication Sig Dispense Refill  . spironolactone (ALDACTONE) 50 MG tablet Take 50 mg by mouth daily.     No current facility-administered medications on file prior to visit.     Social History  Substance Use Topics  . Smoking status: Current Every Day Smoker  . Smokeless tobacco: Never Used  . Alcohol use Yes    Review of Systems  Constitutional: Negative for chills, fever and unexpected weight change.  HENT: Negative for congestion.   Respiratory: Negative for cough.   Cardiovascular: Negative for chest pain, palpitations and leg swelling.  Gastrointestinal: Negative for nausea and vomiting.  Musculoskeletal: Negative for  arthralgias and myalgias.  Skin: Negative for rash.  Neurological: Negative for headaches.  Hematological: Negative for adenopathy.  Psychiatric/Behavioral: Negative for confusion.      Objective:    BP 116/74   Pulse 86   Temp 97.6 F (36.4 C) (Oral)   Ht 5\' 9"  (1.753 m)   Wt 258 lb (117 kg)   SpO2 97%   BMI 38.10 kg/m   BP Readings from Last 3 Encounters:  04/06/17 116/74  03/23/17 136/80   Wt Readings from Last 3 Encounters:  04/06/17 258 lb (117 kg)  03/23/17 260 lb 9.6 oz (118.2 kg)    Physical Exam  Constitutional: She appears well-developed and well-nourished.  Eyes: Conjunctivae are normal.  Neck: No thyroid mass and no thyromegaly present.  Cardiovascular: Normal rate, regular rhythm, normal heart sounds and normal pulses.   Pulmonary/Chest: Effort normal and breath sounds normal. She has no wheezes. She has no rhonchi. She has no rales. Right breast exhibits no inverted nipple, no mass, no nipple discharge, no skin change and no tenderness. Left breast exhibits no inverted nipple, no mass, no nipple discharge, no skin change and no tenderness. Breasts are symmetrical.  No masses or asymmetry appreciated during CBE.  Genitourinary: Uterus is not enlarged, not fixed and not tender. Cervix exhibits no motion tenderness, no discharge and no friability. Right adnexum displays no mass, no tenderness and no fullness. Left adnexum displays no mass, no  tenderness and no fullness.  Genitourinary Comments: Pap performed. No CMT. Unable to appreciated ovaries.  Lymphadenopathy:       Head (right side): No submental, no submandibular, no tonsillar, no preauricular, no posterior auricular and no occipital adenopathy present.       Head (left side): No submental, no submandibular, no tonsillar, no preauricular, no posterior auricular and no occipital adenopathy present.       Right cervical: No superficial cervical, no deep cervical and no posterior cervical adenopathy present.       Left cervical: No superficial cervical, no deep cervical and no posterior cervical adenopathy present.    She has no axillary adenopathy.       Right axillary: No pectoral and no lateral adenopathy present.       Left axillary: No pectoral and no lateral adenopathy present. Neurological: She is alert.  Skin: Skin is warm and dry.  Psychiatric: She has a normal mood and affect. Her speech is normal and behavior is normal. Thought content normal.  Vitals reviewed.      Assessment & Plan:   Problem List Items Addressed This Visit      Other   Routine physical examination - Primary    Pap and clinical breast exam performed. Mammogram is scheduled. Ordered colonoscopy. Screening labs done prior.Encouraged smoking cessation.      Relevant Orders   Ambulatory referral to Gastroenterology   Cytology - PAP       I am having Ms. Fritzler maintain her spironolactone.   No orders of the defined types were placed in this encounter.   Return precautions given.   Risks, benefits, and alternatives of the medications and treatment plan prescribed today were discussed, and patient expressed understanding.   Education regarding symptom management and diagnosis given to patient on AVS.   Continue to follow with Burnard Hawthorne, FNP for routine health maintenance.   Julie Cole and I agreed with plan.   Mable Paris, FNP

## 2017-04-06 NOTE — Assessment & Plan Note (Signed)
Pap and clinical breast exam performed. Mammogram is scheduled. Ordered colonoscopy. Screening labs done prior.Encouraged smoking cessation.

## 2017-04-06 NOTE — Progress Notes (Signed)
Pre visit review using our clinic review tool, if applicable. No additional management support is needed unless otherwise documented below in the visit note. 

## 2017-04-07 LAB — CYTOLOGY - PAP
DIAGNOSIS: NEGATIVE
HPV (WINDOPATH): NOT DETECTED

## 2017-04-12 ENCOUNTER — Ambulatory Visit
Admission: RE | Admit: 2017-04-12 | Discharge: 2017-04-12 | Disposition: A | Discharge status: Home / Self Care | Payer: 59 | Source: Ambulatory Visit | Attending: Family | Admitting: Family

## 2017-04-12 DIAGNOSIS — Z7689 Persons encountering health services in other specified circumstances: Secondary | ICD-10-CM

## 2017-04-12 DIAGNOSIS — Z1231 Encounter for screening mammogram for malignant neoplasm of breast: Secondary | ICD-10-CM | POA: Diagnosis not present

## 2017-06-08 ENCOUNTER — Telehealth: Payer: Self-pay | Admitting: Gastroenterology

## 2017-06-08 ENCOUNTER — Other Ambulatory Visit: Payer: Self-pay

## 2017-06-08 DIAGNOSIS — Z1211 Encounter for screening for malignant neoplasm of colon: Secondary | ICD-10-CM

## 2017-06-08 NOTE — Telephone Encounter (Signed)
Gastroenterology Pre-Procedure Review  Request Date:  Requesting Physician: Dr.   PATIENT REVIEW QUESTIONS: The patient responded to the following health history questions as indicated:    1. Are you having any GI issues? no 2. Do you have a personal history of Polyps? no 3. Do you have a family history of Colon Cancer or Polyps? no 4. Diabetes Mellitus? no 5. Joint replacements in the past 12 months?no 6. Major health problems in the past 3 months?no 7. Any artificial heart valves, MVP, or defibrillator?no    MEDICATIONS & ALLERGIES:    Patient reports the following regarding taking any anticoagulation/antiplatelet therapy:   Plavix, Coumadin, Eliquis, Xarelto, Lovenox, Pradaxa, Brilinta, or Effient? no Aspirin? yes (ASA PRN)  Patient confirms/reports the following medications:  Current Outpatient Medications  Medication Sig Dispense Refill   spironolactone (ALDACTONE) 50 MG tablet Take 50 mg by mouth daily.     No current facility-administered medications for this visit.     Patient confirms/reports the following allergies:  Not on File  No orders of the defined types were placed in this encounter.   AUTHORIZATION INFORMATION Primary Insurance: 1D#: Group #:  Secondary Insurance: 1D#: Group #:  SCHEDULE INFORMATION: Date:  Time: Location:

## 2017-06-26 ENCOUNTER — Ambulatory Visit: Payer: Commercial Managed Care - HMO | Admitting: Registered Nurse

## 2017-06-26 ENCOUNTER — Ambulatory Visit
Admission: RE | Admit: 2017-06-26 | Discharge: 2017-06-26 | Disposition: A | Discharge status: Home / Self Care | Payer: Commercial Managed Care - HMO | Source: Ambulatory Visit | Attending: Gastroenterology | Admitting: Gastroenterology

## 2017-06-26 ENCOUNTER — Encounter
Admission: RE | Disposition: A | Discharge status: Home / Self Care | Payer: Self-pay | Source: Ambulatory Visit | Attending: Gastroenterology

## 2017-06-26 DIAGNOSIS — K640 First degree hemorrhoids: Secondary | ICD-10-CM | POA: Diagnosis not present

## 2017-06-26 DIAGNOSIS — Z79899 Other long term (current) drug therapy: Secondary | ICD-10-CM | POA: Insufficient documentation

## 2017-06-26 DIAGNOSIS — D124 Benign neoplasm of descending colon: Secondary | ICD-10-CM | POA: Diagnosis not present

## 2017-06-26 DIAGNOSIS — D125 Benign neoplasm of sigmoid colon: Secondary | ICD-10-CM

## 2017-06-26 DIAGNOSIS — K5730 Diverticulosis of large intestine without perforation or abscess without bleeding: Secondary | ICD-10-CM | POA: Diagnosis not present

## 2017-06-26 DIAGNOSIS — K635 Polyp of colon: Secondary | ICD-10-CM | POA: Diagnosis not present

## 2017-06-26 DIAGNOSIS — K219 Gastro-esophageal reflux disease without esophagitis: Secondary | ICD-10-CM | POA: Insufficient documentation

## 2017-06-26 DIAGNOSIS — Z1211 Encounter for screening for malignant neoplasm of colon: Secondary | ICD-10-CM

## 2017-06-26 DIAGNOSIS — F17200 Nicotine dependence, unspecified, uncomplicated: Secondary | ICD-10-CM | POA: Diagnosis not present

## 2017-06-26 DIAGNOSIS — K5790 Diverticulosis of intestine, part unspecified, without perforation or abscess without bleeding: Secondary | ICD-10-CM | POA: Diagnosis not present

## 2017-06-26 HISTORY — PX: COLONOSCOPY WITH PROPOFOL: SHX5780

## 2017-06-26 LAB — POCT PREGNANCY, URINE: Preg Test, Ur: NEGATIVE

## 2017-06-26 SURGERY — COLONOSCOPY WITH PROPOFOL
Anesthesia: General

## 2017-06-26 MED ORDER — LIDOCAINE HCL (CARDIAC) 20 MG/ML IV SOLN
INTRAVENOUS | Status: DC | PRN
  Administered 2017-06-26: 40 mg via INTRAVENOUS

## 2017-06-26 MED ORDER — PHENYLEPHRINE HCL 10 MG/ML IJ SOLN
INTRAMUSCULAR | Status: DC | PRN
  Administered 2017-06-26 (×2): 100 ug via INTRAVENOUS

## 2017-06-26 MED ORDER — SODIUM CHLORIDE 0.9 % IV SOLN
INTRAVENOUS | Status: DC
  Administered 2017-06-26: 1000 mL via INTRAVENOUS

## 2017-06-26 MED ORDER — PROPOFOL 10 MG/ML IV BOLUS
INTRAVENOUS | Status: DC | PRN
  Administered 2017-06-26: 40 mg via INTRAVENOUS
  Administered 2017-06-26: 20 mg via INTRAVENOUS
  Administered 2017-06-26: 30 mg via INTRAVENOUS
  Administered 2017-06-26: 20 mg via INTRAVENOUS
  Administered 2017-06-26: 30 mg via INTRAVENOUS
  Administered 2017-06-26: 70 mg via INTRAVENOUS

## 2017-06-26 MED ORDER — PROPOFOL 500 MG/50ML IV EMUL
INTRAVENOUS | Status: DC | PRN
  Administered 2017-06-26: 140 ug/kg/min via INTRAVENOUS

## 2017-06-26 NOTE — Anesthesia Post-op Follow-up Note (Signed)
Anesthesia QCDR form completed.        

## 2017-06-26 NOTE — Anesthesia Preprocedure Evaluation (Signed)
Anesthesia Evaluation  Patient identified by MRN, date of birth, ID band Patient awake    Reviewed: Allergy & Precautions, NPO status , Patient's Chart, lab work & pertinent test results  History of Anesthesia Complications Negative for: history of anesthetic complications  Airway Mallampati: II  TM Distance: >3 FB Neck ROM: Full    Dental no notable dental hx.    Pulmonary neg sleep apnea, neg COPD, Current Smoker,    breath sounds clear to auscultation- rhonchi (-) wheezing      Cardiovascular Exercise Tolerance: Good (-) hypertension(-) CAD, (-) Past MI, (-) Cardiac Stents and (-) CABG  Rhythm:Regular Rate:Normal - Systolic murmurs and - Diastolic murmurs    Neuro/Psych negative neurological ROS  negative psych ROS   GI/Hepatic Neg liver ROS, GERD  ,  Endo/Other  negative endocrine ROSneg diabetes  Renal/GU negative Renal ROS     Musculoskeletal negative musculoskeletal ROS (+)   Abdominal (+) + obese,   Peds  Hematology negative hematology ROS (+)   Anesthesia Other Findings Past Medical History: No date: Chicken pox No date: GERD (gastroesophageal reflux disease)   Reproductive/Obstetrics                             Anesthesia Physical Anesthesia Plan  ASA: II  Anesthesia Plan: General   Post-op Pain Management:    Induction: Intravenous  PONV Risk Score and Plan: 2 and Propofol infusion  Airway Management Planned: Natural Airway  Additional Equipment:   Intra-op Plan:   Post-operative Plan:   Informed Consent: I have reviewed the patients History and Physical, chart, labs and discussed the procedure including the risks, benefits and alternatives for the proposed anesthesia with the patient or authorized representative who has indicated his/her understanding and acceptance.   Dental advisory given  Plan Discussed with: CRNA and Anesthesiologist  Anesthesia Plan  Comments:         Anesthesia Quick Evaluation

## 2017-06-26 NOTE — Anesthesia Postprocedure Evaluation (Signed)
Anesthesia Post Note  Patient: Julie Cole  Procedure(s) Performed: COLONOSCOPY WITH PROPOFOL (N/A )  Patient location during evaluation: Endoscopy Anesthesia Type: General Level of consciousness: awake and alert and oriented Pain management: pain level controlled Vital Signs Assessment: post-procedure vital signs reviewed and stable Respiratory status: spontaneous breathing, nonlabored ventilation and respiratory function stable Cardiovascular status: blood pressure returned to baseline and stable Postop Assessment: no signs of nausea or vomiting Anesthetic complications: no     Last Vitals:  Vitals:   06/26/17 0952 06/26/17 1002  BP: (!) 125/54 128/71  Pulse: 69 61  Resp: 15 13  Temp:    SpO2: 98% 98%    Last Pain:  Vitals:   06/26/17 0932  TempSrc: Tympanic                 Aldena Worm

## 2017-06-26 NOTE — Op Note (Signed)
[facility] Gastroenterology Patient Name: Julie Cole Procedure Date: 06/26/2017 8:59 AM MRN: [id number] Account #: [id number] Date of Birth: Oct 30, 1965 Admit Type: Outpatient Age: 52 Room: [facility] ENDO ROOM 4 Gender: Female Note Status: Finalized Procedure:            Colonoscopy Indications:          Screening for colorectal malignant neoplasm Providers:            Jonathon Bellows MD, MD Referring MD:         Yvetta Coder. Arnett (Referring MD) Medicines:            Monitored Anesthesia Care Complications:        No immediate complications. Procedure:            Pre-Anesthesia Assessment:                       - Prior to the procedure, a History and Physical was                        performed, and patient medications, allergies and                        sensitivities were reviewed. The patient's tolerance of                        previous anesthesia was reviewed.                       - The risks and benefits of the procedure and the                        sedation options and risks were discussed with the                        patient. All questions were answered and informed                        consent was obtained.                       - ASA Grade Assessment: II - A patient with mild                        systemic disease.                       After obtaining informed consent, the colonoscope was                        passed under direct vision. Throughout the procedure,                        the patient's blood pressure, pulse, and oxygen                        saturations were monitored continuously. The                        Colonoscope was introduced through the anus and  advanced to the the cecum, identified by the                        appendiceal orifice, IC valve and transillumination.                        The colonoscopy was performed with ease. The patient                        tolerated the procedure well. The  quality of the bowel                        preparation was good. Findings:      The perianal and digital rectal examinations were normal.      Multiple medium-mouthed diverticula were found in the sigmoid colon.      Non-bleeding internal hemorrhoids were found during retroflexion. The       hemorrhoids were medium-sized and Grade I (internal hemorrhoids that do       not prolapse).      Two sessile polyps were found in the sigmoid colon and descending colon.       The polyps were 3 to 4 mm in size. These polyps were removed with a cold       biopsy forceps. Resection and retrieval were complete.      The exam was otherwise without abnormality on direct and retroflexion       views. Impression:           - Diverticulosis in the sigmoid colon.                       - Non-bleeding internal hemorrhoids.                       - Two 3 to 4 mm polyps in the sigmoid colon and in the                        descending colon, removed with a cold biopsy forceps.                        Resected and retrieved.                       - The examination was otherwise normal on direct and                        retroflexion views. Recommendation:       - Discharge patient to home (with escort).                       - Resume previous diet.                       - Continue present medications.                       - Await pathology results.                       - Repeat colonoscopy in 5-10 years for surveillance  based on pathology results. Procedure Code(s):    --- Professional ---                       [phone], Colonoscopy, flexible; with biopsy, single or                        multiple Diagnosis Code(s):    --- Professional ---                       Z12.11, Encounter for screening for malignant neoplasm                        of colon                       K64.0, First degree hemorrhoids                       D12.5, Benign neoplasm of sigmoid colon                        D12.4, Benign neoplasm of descending colon                       K57.30, Diverticulosis of large intestine without                        perforation or abscess without bleeding CPT copyright 2016 American Medical Association. All rights reserved. The codes documented in this report are preliminary and upon coder review may  be revised to meet current compliance requirements. Jonathon Bellows, MD Jonathon Bellows MD, MD 06/26/2017 9:29:05 AM This report has been signed electronically. Number of Addenda: 0 Note Initiated On: 06/26/2017 8:59 AM Scope Withdrawal Time: 0 hours 9 minutes 14 seconds  Total Procedure Duration: 0 hours 21 minutes 3 seconds       [facility]

## 2017-06-26 NOTE — H&P (Signed)
     Jonathon Bellows, MD [address] [place], [place], [place], [address] [id number], [place], [place], [place], [address] Phone: [phone]  Fax: [phone]  Primary Care Physician:  Burnard Hawthorne, FNP   Pre-Procedure History & Physical: HPI:  Julie Cole is a 52 y.o. female is here for an colonoscopy.   Past Medical History:  Diagnosis Date  . Chicken pox   . GERD (gastroesophageal reflux disease)     Past Surgical History:  Procedure Laterality Date  . BREAST BIOPSY Left 02/07/2007   neg  . BREAST SURGERY    . ROTATOR CUFF REPAIR      Prior to Admission medications   Medication Sig Start Date End Date Taking? Authorizing Provider  spironolactone (ALDACTONE) 50 MG tablet Take 50 mg by mouth daily.   Yes [provider]    Allergies as of 06/08/2017  . (Not on File)    Family History  Problem Relation Age of Onset  . Colon cancer Neg Hx   . Breast cancer Neg Hx     Social History   Socioeconomic History  . Marital status: Single    Spouse name: Not on file  . Number of children: Not on file  . Years of education: Not on file  . Highest education level: Not on file  Social Needs  . Financial resource strain: Not on file  . Food insecurity - worry: Not on file  . Food insecurity - inability: Not on file  . Transportation needs - medical: Not on file  . Transportation needs - non-medical: Not on file  Occupational History  . Not on file  Tobacco Use  . Smoking status: Current Every Day Smoker  . Smokeless tobacco: Never Used  Substance and Sexual Activity  . Alcohol use: Yes  . Drug use: Yes    Types: Marijuana  . Sexual activity: No  Other Topics Concern  . Not on file  Social History Narrative   Works at [employer]: See HPI, otherwise negative ROS  Physical Exam: BP 128/71   Pulse 61   Temp (!) 97.2 F (36.2 C)   Resp 13   Ht 5\' 9"  (1.753 m)   Wt 250 lb (113.4 kg)   SpO2 98%   BMI  36.92 kg/m  General:   Alert,  pleasant and cooperative in NAD Head:  Normocephalic and atraumatic. Neck:  Supple; no masses or thyromegaly. Lungs:  Clear throughout to auscultation, normal respiratory effort.    Heart:  +S1, +S2, Regular rate and rhythm, No edema. Abdomen:  Soft, nontender and nondistended. Normal bowel sounds, without guarding, and without rebound.   Neurologic:  Alert and  oriented x4;  grossly normal neurologically.  Impression/Plan: Julie Cole is here for an colonoscopy to be performed for Screening colonoscopy average risk   Risks, benefits, limitations, and alternatives regarding  colonoscopy have been reviewed with the patient.  Questions have been answered.  All parties agreeable.   Jonathon Bellows, MD  06/26/2017, 1:49 PM

## 2017-06-26 NOTE — Transfer of Care (Signed)
Immediate Anesthesia Transfer of Care Note  Patient: Julie Cole  Procedure(s) Performed: COLONOSCOPY WITH PROPOFOL (N/A )  Patient Location: PACU  Anesthesia Type:General  Level of Consciousness: awake, alert  and oriented  Airway & Oxygen Therapy: Patient Spontanous Breathing and Patient connected to nasal cannula oxygen  Post-op Assessment: Report given to RN and Post -op Vital signs reviewed and stable  Post vital signs: Reviewed and stable  Last Vitals:  Vitals:   06/26/17 0932 06/26/17 0935  BP: (!) 88/44 (!) 88/44  Pulse: 70 73  Resp: 12 11  Temp: (!) 36.2 C (!) 36.2 C  SpO2: 98% 100%    Last Pain:  Vitals:   06/26/17 0932  TempSrc: Tympanic         Complications: No apparent anesthesia complications

## 2017-06-27 ENCOUNTER — Encounter: Payer: Self-pay | Admitting: Gastroenterology

## 2017-06-27 LAB — SURGICAL PATHOLOGY

## 2017-06-28 ENCOUNTER — Encounter: Payer: Self-pay | Admitting: Gastroenterology

## 2017-10-31 DIAGNOSIS — L700 Acne vulgaris: Secondary | ICD-10-CM | POA: Diagnosis not present

## 2017-10-31 DIAGNOSIS — L720 Epidermal cyst: Secondary | ICD-10-CM | POA: Diagnosis not present

## 2018-04-17 ENCOUNTER — Other Ambulatory Visit: Payer: Self-pay | Admitting: Family

## 2018-04-17 DIAGNOSIS — Z1231 Encounter for screening mammogram for malignant neoplasm of breast: Secondary | ICD-10-CM

## 2018-04-18 ENCOUNTER — Ambulatory Visit
Admission: RE | Admit: 2018-04-18 | Discharge: 2018-04-18 | Disposition: A | Discharge status: Home / Self Care | Payer: 59 | Source: Ambulatory Visit | Attending: Family | Admitting: Family

## 2018-04-18 DIAGNOSIS — Z1231 Encounter for screening mammogram for malignant neoplasm of breast: Secondary | ICD-10-CM | POA: Insufficient documentation

## 2019-04-10 ENCOUNTER — Other Ambulatory Visit: Payer: Self-pay | Admitting: Family

## 2019-04-19 ENCOUNTER — Ambulatory Visit: Payer: 59 | Admitting: Internal Medicine

## 2019-04-22 ENCOUNTER — Other Ambulatory Visit: Payer: Self-pay

## 2019-04-22 ENCOUNTER — Ambulatory Visit (INDEPENDENT_AMBULATORY_CARE_PROVIDER_SITE_OTHER): Payer: 59 | Admitting: Internal Medicine

## 2019-04-22 ENCOUNTER — Encounter: Payer: Self-pay | Admitting: Internal Medicine

## 2019-04-22 DIAGNOSIS — Z1231 Encounter for screening mammogram for malignant neoplasm of breast: Secondary | ICD-10-CM | POA: Diagnosis not present

## 2019-04-22 DIAGNOSIS — E7800 Pure hypercholesterolemia, unspecified: Secondary | ICD-10-CM

## 2019-04-22 DIAGNOSIS — M25569 Pain in unspecified knee: Secondary | ICD-10-CM

## 2019-04-22 NOTE — Progress Notes (Addendum)
Patient ID: Julie Cole, female   DOB: May 03, 1966, 53 y.o.   MRN: [id number]   Virtual Visit via video Note  This visit type was conducted due to national recommendations for restrictions regarding the COVID-19 pandemic (e.g. social distancing).  This format is felt to be most appropriate for this patient at this time.  All issues noted in this document were discussed and addressed.  No physical exam was performed (except for noted visual exam findings with Video Visits).   I connected with Julie Cole by a video enabled telemedicine application and verified that I am speaking with the correct person using two identifiers. Location patient: home Location provider: work Persons participating in the virtual visit: patient, provider  I discussed the limitations, risks, security and privacy concerns of performing an evaluation and management service by video and the availability of in person appointments. The patient expressed understanding and agreed to proceed.   Reason for visit: work in appt  HPI: Pt of Mable [place].  Needs mammogram scheduled.  She reports she is doing relatively well.  Trying to stay active.  No chest pain.  No sob.  Acid reflux under control.  No abdominal pain.  Bowels moving.  Had colonoscopy 06/2017 - diverticulosis and internal hemorrhoids with two 3-77mm polyps.  No menstrual period since 08/2018.     ROS: See pertinent positives and negatives per HPI.  Past Medical History:  Diagnosis Date  . Chicken pox   . GERD (gastroesophageal reflux disease)     Past Surgical History:  Procedure Laterality Date  . BREAST BIOPSY Left 02/07/2007   neg  . BREAST SURGERY    . COLONOSCOPY WITH PROPOFOL N/A 06/26/2017   Procedure: COLONOSCOPY WITH PROPOFOL;  Surgeon: Jonathon Bellows, MD;  Location: [facility] ENDOSCOPY;  Service: Gastroenterology;  Laterality: N/A;  . ROTATOR CUFF REPAIR      Family History  Problem Relation Age of Onset  . Colon cancer Neg Hx   .  Breast cancer Neg Hx     SOCIAL HX: reviewed.    Current Outpatient Medications:  .  spironolactone (ALDACTONE) 50 MG tablet, Take 50 mg by mouth daily., Disp: , Rfl:   EXAM:  GENERAL: alert, oriented, appears well and in no acute distress  HEENT: atraumatic, conjunttiva clear, no obvious abnormalities on inspection of external nose and ears  NECK: normal movements of the head and neck  LUNGS: on inspection no signs of respiratory distress, breathing rate appears normal, no obvious gross SOB, gasping or wheezing  CV: no obvious cyanosis  PSYCH/NEURO: pleasant and cooperative, no obvious depression or anxiety, speech and thought processing grossly intact  ASSESSMENT AND PLAN:  Discussed the following assessment and plan:  BREAST CANCER SCREENING.  Pt overdue mammogram.  Schedule f/u mammogram. Pt wants scheduled at [facility].    HYPERCHOLESTEROLEMIA.  Low cholesterol diet and exercise.  Follow lipid panel.     I discussed the assessment and treatment plan with the patient. The patient was provided an opportunity to ask questions and all were answered. The patient agreed with the plan and demonstrated an understanding of the instructions.   The patient was advised to call back or seek an in-person evaluation if the symptoms worsen or if the condition fails to improve as anticipated.  Error:  The knee pain diagnosis was entered into chart by mistake.  Does not apply to this pt.      Einar Pheasant, MD

## 2019-04-23 ENCOUNTER — Encounter: Payer: Self-pay | Admitting: Internal Medicine

## 2019-04-23 DIAGNOSIS — Z1239 Encounter for other screening for malignant neoplasm of breast: Secondary | ICD-10-CM | POA: Insufficient documentation

## 2019-04-23 DIAGNOSIS — E7800 Pure hypercholesterolemia, unspecified: Secondary | ICD-10-CM | POA: Insufficient documentation

## 2019-04-23 DIAGNOSIS — E785 Hyperlipidemia, unspecified: Secondary | ICD-10-CM | POA: Insufficient documentation

## 2019-04-23 DIAGNOSIS — M25569 Pain in unspecified knee: Secondary | ICD-10-CM | POA: Insufficient documentation

## 2019-04-23 NOTE — Assessment & Plan Note (Signed)
Low cholesterol diet and exercise.  Follow lipid panel.   

## 2019-04-23 NOTE — Assessment & Plan Note (Signed)
Persistent knee pain and previous hip pain.  Persistent.  Request referral to ortho.  She will call with name of ortho physician she desires to see.

## 2019-04-23 NOTE — Assessment & Plan Note (Addendum)
Overdue mammogram.  Schedule mammogram at [facility] - pt preference.

## 2019-05-06 ENCOUNTER — Encounter: Payer: Self-pay | Admitting: Family

## 2019-05-28 ENCOUNTER — Other Ambulatory Visit: Payer: Self-pay

## 2019-06-06 ENCOUNTER — Other Ambulatory Visit (INDEPENDENT_AMBULATORY_CARE_PROVIDER_SITE_OTHER): Payer: 59

## 2019-06-06 ENCOUNTER — Ambulatory Visit
Admission: RE | Admit: 2019-06-06 | Discharge: 2019-06-06 | Disposition: A | Discharge status: Home / Self Care | Payer: 59 | Source: Ambulatory Visit | Attending: Internal Medicine | Admitting: Internal Medicine

## 2019-06-06 ENCOUNTER — Other Ambulatory Visit: Payer: Self-pay

## 2019-06-06 ENCOUNTER — Other Ambulatory Visit: Payer: 59

## 2019-06-06 DIAGNOSIS — E7800 Pure hypercholesterolemia, unspecified: Secondary | ICD-10-CM | POA: Diagnosis not present

## 2019-06-06 DIAGNOSIS — Z1231 Encounter for screening mammogram for malignant neoplasm of breast: Secondary | ICD-10-CM | POA: Diagnosis not present

## 2019-06-06 LAB — CBC WITH DIFFERENTIAL/PLATELET
Basophils Absolute: 0 10*3/uL (ref 0.0–0.1)
Basophils Relative: 1.2 % (ref 0.0–3.0)
Eosinophils Absolute: 0.1 10*3/uL (ref 0.0–0.7)
Eosinophils Relative: 3 % (ref 0.0–5.0)
HCT: 39.4 % (ref 36.0–46.0)
Hemoglobin: 13.3 g/dL (ref 12.0–15.0)
Lymphocytes Relative: 37.3 % (ref 12.0–46.0)
Lymphs Abs: 1.3 10*3/uL (ref 0.7–4.0)
MCHC: 33.7 g/dL (ref 30.0–36.0)
MCV: 98.7 fl (ref 78.0–100.0)
Monocytes Absolute: 0.3 10*3/uL (ref 0.1–1.0)
Monocytes Relative: 8.1 % (ref 3.0–12.0)
Neutro Abs: 1.7 10*3/uL (ref 1.4–7.7)
Neutrophils Relative %: 50.4 % (ref 43.0–77.0)
Platelets: 362 10*3/uL (ref 150.0–400.0)
RBC: 3.99 Mil/uL (ref 3.87–5.11)
RDW: 14.3 % (ref 11.5–15.5)
WBC: 3.5 10*3/uL — ABNORMAL LOW (ref 4.0–10.5)

## 2019-06-06 LAB — COMPREHENSIVE METABOLIC PANEL
ALT: 15 U/L (ref 0–35)
AST: 23 U/L (ref 0–37)
Albumin: 4.1 g/dL (ref 3.5–5.2)
Alkaline Phosphatase: 76 U/L (ref 39–117)
BUN: 10 mg/dL (ref 6–23)
CO2: 26 mEq/L (ref 19–32)
Calcium: 8.9 mg/dL (ref 8.4–10.5)
Chloride: 105 mEq/L (ref 96–112)
Creatinine, Ser: 0.71 mg/dL (ref 0.40–1.20)
GFR: 86.02 mL/min (ref >60.00)
Glucose, Bld: 100 mg/dL — ABNORMAL HIGH (ref 70–99)
Potassium: 3.6 mEq/L (ref 3.5–5.1)
Sodium: 140 mEq/L (ref 135–145)
Total Bilirubin: 0.5 mg/dL (ref 0.2–1.2)
Total Protein: 6.8 g/dL (ref 6.0–8.3)

## 2019-06-06 LAB — LIPID PANEL
Cholesterol: 204 mg/dL — ABNORMAL HIGH (ref 0–200)
HDL: 63.6 mg/dL (ref >39.00)
NonHDL: 140.5
Total CHOL/HDL Ratio: 3
Triglycerides: 229 mg/dL — ABNORMAL HIGH (ref 0.0–149.0)
VLDL: 45.8 mg/dL — ABNORMAL HIGH (ref 0.0–40.0)

## 2019-06-06 LAB — TSH: TSH: 1.3 u[IU]/mL (ref 0.35–4.50)

## 2019-06-06 LAB — LDL CHOLESTEROL, DIRECT: Direct LDL: 112 mg/dL

## 2019-07-19 ENCOUNTER — Encounter: Payer: Self-pay | Admitting: Family

## 2019-07-19 ENCOUNTER — Ambulatory Visit (INDEPENDENT_AMBULATORY_CARE_PROVIDER_SITE_OTHER): Payer: 59 | Admitting: Family

## 2019-07-19 VITALS — Ht 69.0 in

## 2019-07-19 DIAGNOSIS — D709 Neutropenia, unspecified: Secondary | ICD-10-CM | POA: Diagnosis not present

## 2019-07-19 DIAGNOSIS — Z0289 Encounter for other administrative examinations: Secondary | ICD-10-CM

## 2019-07-19 DIAGNOSIS — E785 Hyperlipidemia, unspecified: Secondary | ICD-10-CM | POA: Diagnosis not present

## 2019-07-19 NOTE — Assessment & Plan Note (Signed)
Discussed elevated total cholesterol and triglycerides.  Discussed lifestyle modifications including low trans and saturated fats.  Advised to continue to follow this at least annually especially as she is at increased risk due to her smoking history. patient verbalized understanding

## 2019-07-19 NOTE — Assessment & Plan Note (Signed)
Discussed previous white blood cell count which had been low in the past. As we have no data in between for the past 2 years, question whether or not patient's baseline is on the low side.  We will repeat CBC and if persistently low, we will refer to hematology

## 2019-07-19 NOTE — Progress Notes (Signed)
Virtual Visit via Video Note  I connected with@  on 07/19/19 at  8:00 AM EST by a video enabled telemedicine application and verified that I am speaking with the correct person using two identifiers.  Location patient: home Location provider:work  Persons participating in the virtual visit: patient, provider  I discussed the limitations of evaluation and management by telemedicine and the availability of in person appointments. The patient expressed understanding and agreed to proceed.   HPI: Follow-up, has not been seen in 2 years  feels well today, no complaints.  No joint pain, unusual weight loss.   coping well with pandemic.  Back to work at [employer] physical therapy. Continues to smoke, although cutting back. Would like to discuss lab results.  Acne-on spironolactone for this, following with dermatology  MM UTD Due for pap ROS: See pertinent positives and negatives per HPI.  Past Medical History:  Diagnosis Date  . Chicken pox   . GERD (gastroesophageal reflux disease)     Past Surgical History:  Procedure Laterality Date  . BREAST BIOPSY Left 02/07/2007   neg  . BREAST SURGERY    . COLONOSCOPY WITH PROPOFOL N/A 06/26/2017   Procedure: COLONOSCOPY WITH PROPOFOL;  Surgeon: Jonathon Bellows, MD;  Location: [facility] ENDOSCOPY;  Service: Gastroenterology;  Laterality: N/A;  . ROTATOR CUFF REPAIR      Family History  Problem Relation Age of Onset  . Colon cancer Neg Hx   . Breast cancer Neg Hx     SOCIAL HX: smoking   Current Outpatient Medications:  .  spironolactone (ALDACTONE) 50 MG tablet, Take 50 mg by mouth daily., Disp: , Rfl:   EXAM:  VITALS per patient if applicable: BP Readings from Last 3 Encounters:  06/26/17 128/71  04/06/17 116/74  03/23/17 136/80    GENERAL: alert, oriented, appears well and in no acute distress  HEENT: atraumatic, conjunttiva clear, no obvious abnormalities on inspection of external nose and ears  NECK: normal movements of the head  and neck  LUNGS: on inspection no signs of respiratory distress, breathing rate appears normal, no obvious gross SOB, gasping or wheezing  CV: no obvious cyanosis  MS: moves all visible extremities without noticeable abnormality  PSYCH/NEURO: pleasant and cooperative, no obvious depression or anxiety, speech and thought processing grossly intact  ASSESSMENT AND PLAN:  Discussed the following assessment and plan:  Neutropenia, unspecified type ([place]) - Plan: CBC with Differential/Platelet  Hyperlipidemia, unspecified hyperlipidemia type Problem List Items Addressed This Visit      Other   HLD (hyperlipidemia)    Discussed elevated total cholesterol and triglycerides.  Discussed lifestyle modifications including low trans and saturated fats.  Advised to continue to follow this at least annually especially as she is at increased risk due to her smoking history. patient verbalized understanding      Neutropenia ([place]) - Primary    Discussed previous white blood cell count which had been low in the past. As we have no data in between for the past 2 years, question whether or not patient's baseline is on the low side.  We will repeat CBC and if persistently low, we will refer to hematology      Relevant Orders   CBC with Differential/Platelet      -we discussed possible serious and likely etiologies, options for evaluation and workup, limitations of telemedicine visit vs in person visit, treatment, treatment risks and precautions. Pt prefers to treat via telemedicine empirically rather then risking or undertaking an in person visit at  this moment. Patient agrees to seek prompt in person care if worsening, new symptoms arise, or if is not improving with treatment.   I discussed the assessment and treatment plan with the patient. The patient was provided an opportunity to ask questions and all were answered. The patient agreed with the plan and demonstrated an understanding of the  instructions.   The patient was advised to call back or seek an in-person evaluation if the symptoms worsen or if the condition fails to improve as anticipated.   Mable Paris, FNP

## 2020-07-03 ENCOUNTER — Encounter: Payer: Self-pay | Admitting: *Deleted

## 2020-11-09 IMAGING — MG DIGITAL SCREENING BILAT W/ TOMO W/ CAD
6 of 12 series · 6 of 36 positions shown · non-contrast
Comparison: Previous exam(s).

CLINICAL DATA: Screening.

EXAM:
DIGITAL SCREENING BILATERAL MAMMOGRAM WITH TOMO AND CAD

[R CC synth-2D (1 of 2)]
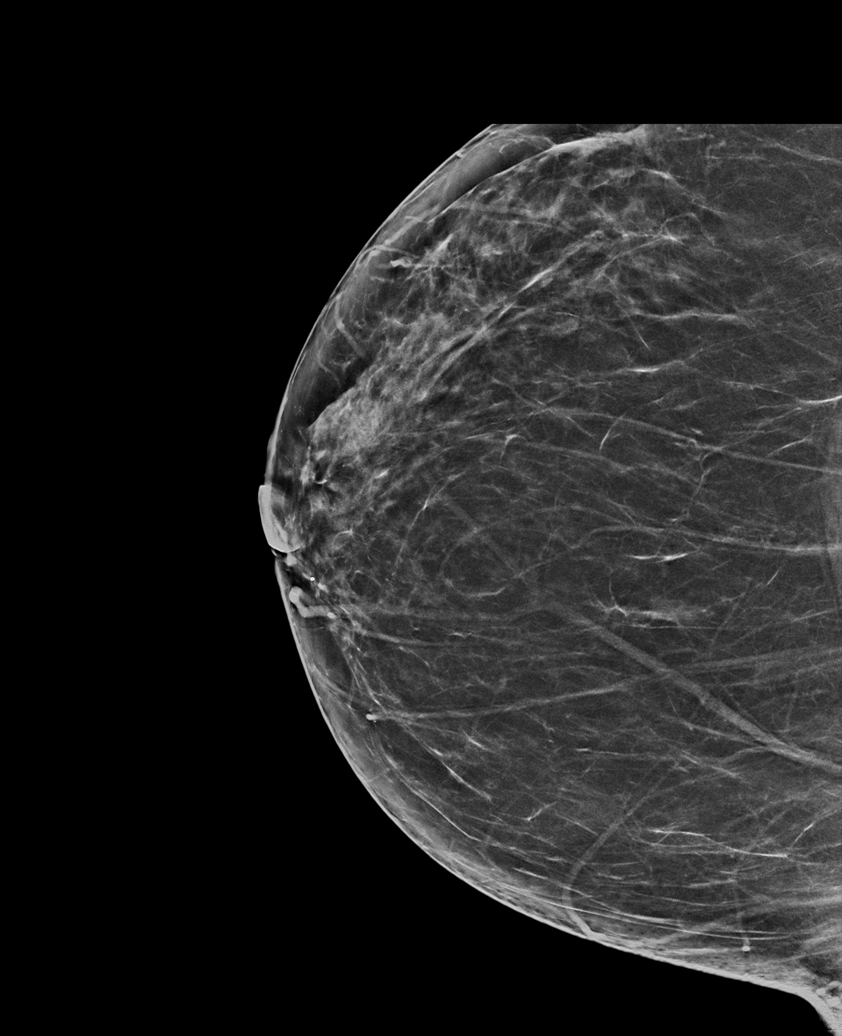

[R MLO synth-2D]
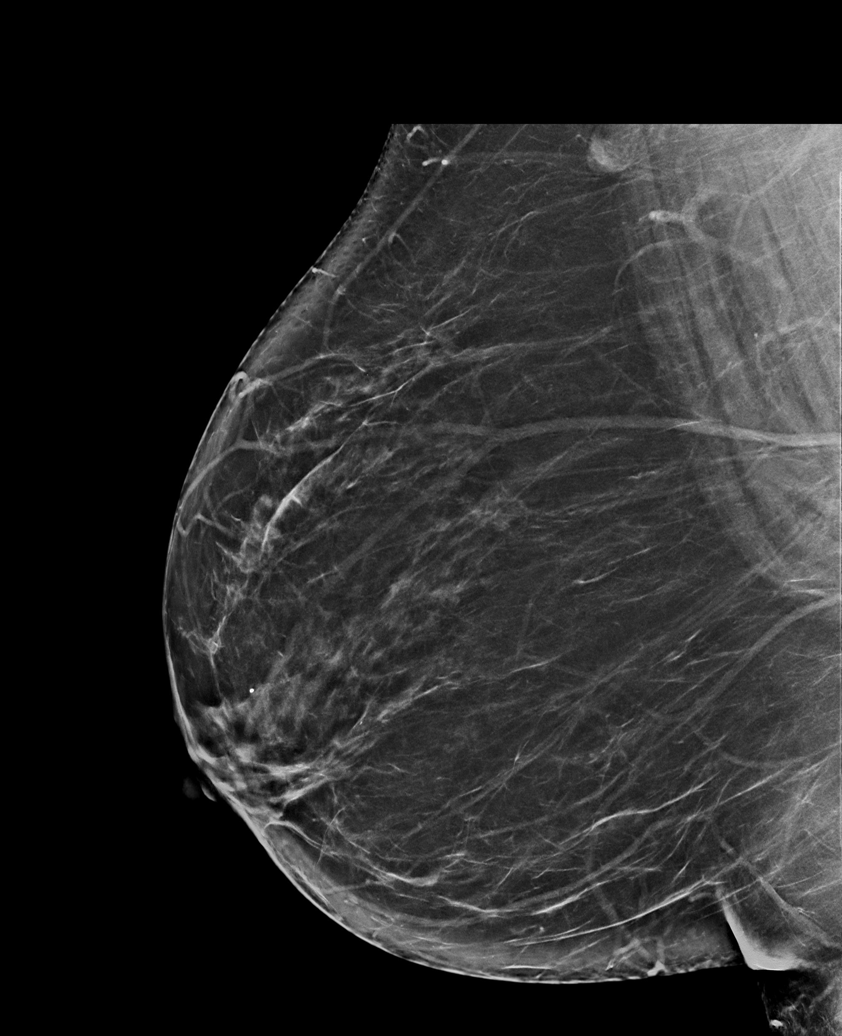

[L MLO synth-2D]
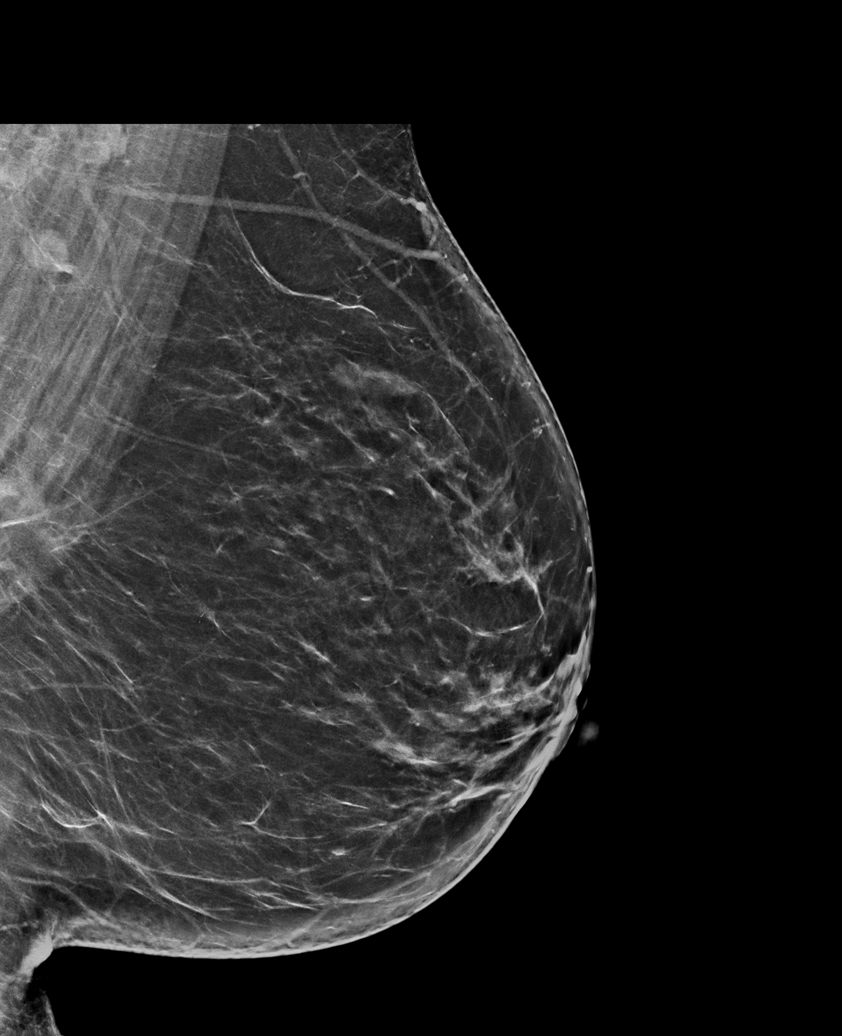

[L CC synth-2D (1 of 2)]
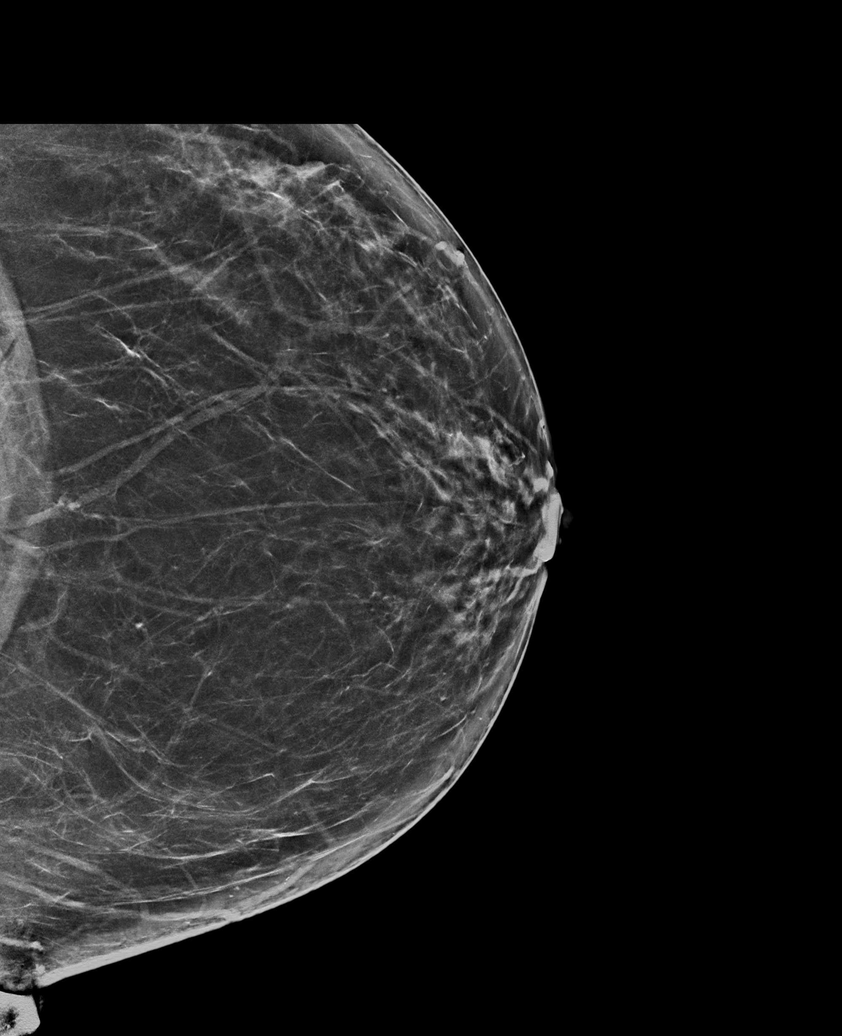

[L CC synth-2D (2 of 2)]
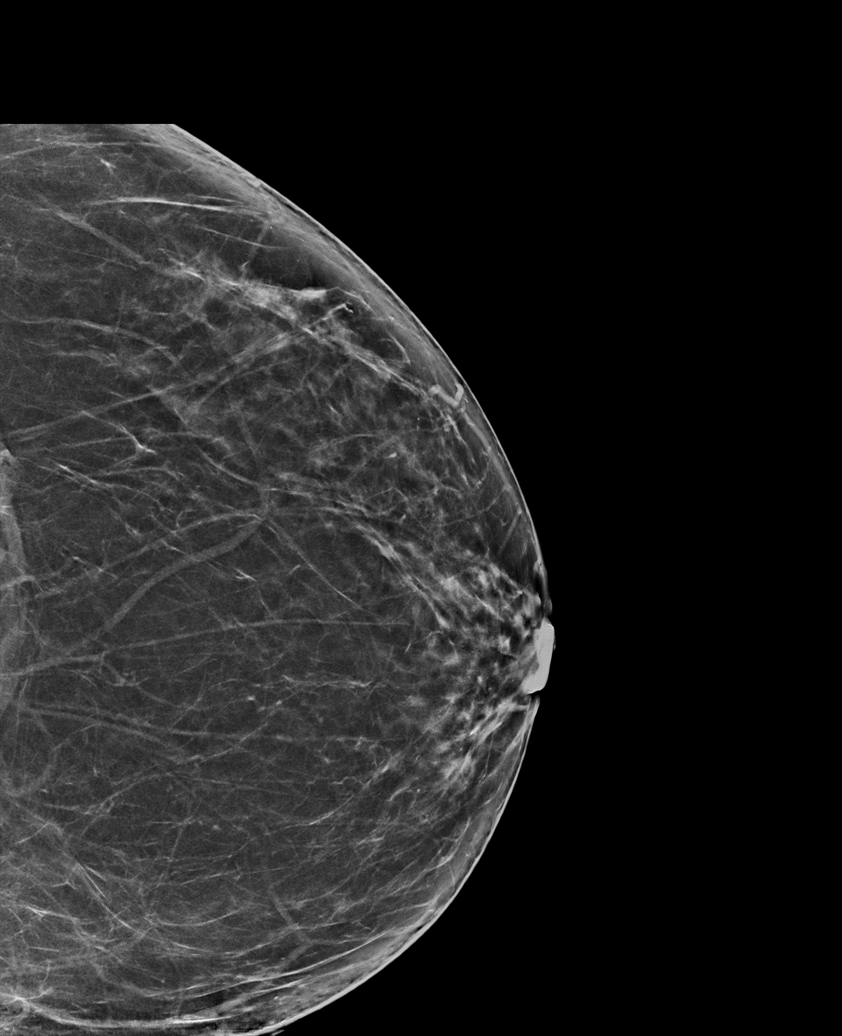

[R CC synth-2D (2 of 2)]
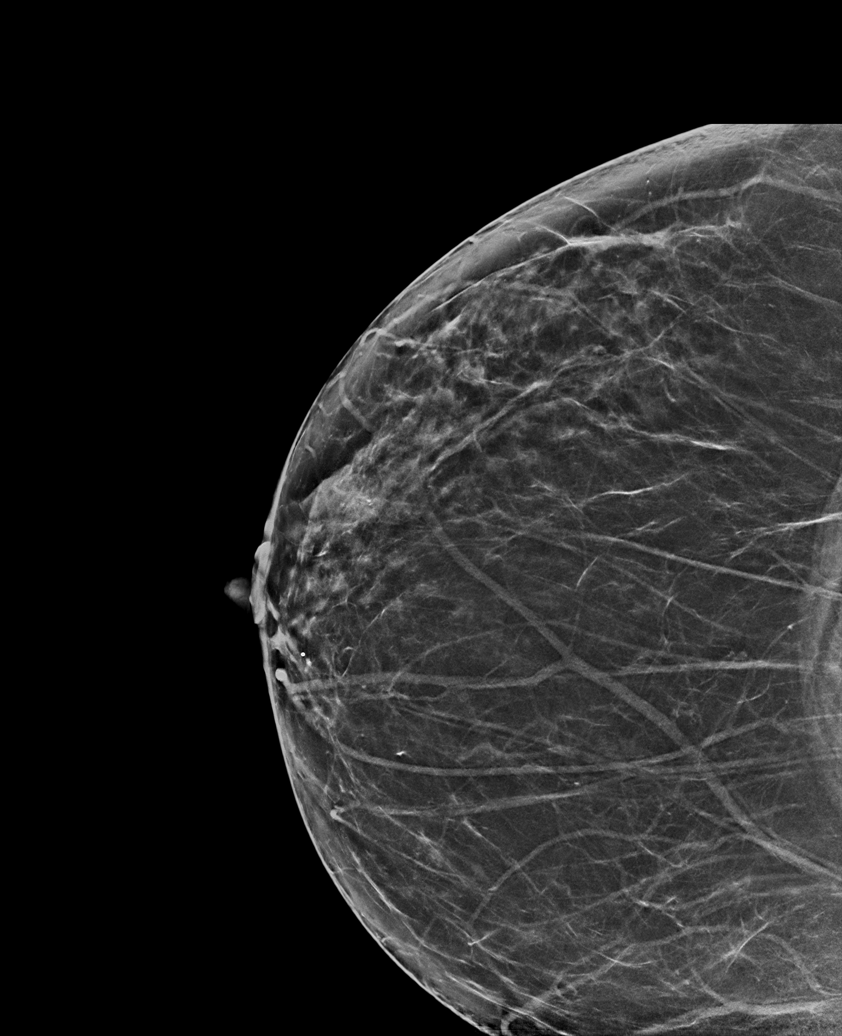

[6 of 36 positions shown; findings below may reference images not displayed]

ACR Breast Density Category b: There are scattered areas of
fibroglandular density.
FINDINGS: There are no findings suspicious for malignancy. Images were
processed with CAD.
IMPRESSION: No mammographic evidence of malignancy. A result letter of this
screening mammogram will be mailed directly to the patient.

RECOMMENDATION:
Screening mammogram in one year. (Code:CN-U-775)

BI-RADS CATEGORY  1: Negative.
